# Patient Record
Sex: Male | Born: 1995 | Race: Black or African American | Hispanic: No | Marital: Single | Smoking: Current every day smoker
Health system: Southern US, Community
[De-identification: ages and names within clinical notes are randomized; demographics above are authoritative.]

## PROBLEM LIST (undated history)

## (undated) DIAGNOSIS — D571 Sickle-cell disease without crisis: Secondary | ICD-10-CM

## (undated) DIAGNOSIS — F845 Asperger's syndrome: Secondary | ICD-10-CM

## (undated) DIAGNOSIS — F319 Bipolar disorder, unspecified: Secondary | ICD-10-CM

## (undated) DIAGNOSIS — R620 Delayed milestone in childhood: Secondary | ICD-10-CM

## (undated) DIAGNOSIS — F32A Depression, unspecified: Secondary | ICD-10-CM

## (undated) DIAGNOSIS — R7303 Prediabetes: Secondary | ICD-10-CM

## (undated) DIAGNOSIS — F329 Major depressive disorder, single episode, unspecified: Secondary | ICD-10-CM

## (undated) DIAGNOSIS — F909 Attention-deficit hyperactivity disorder, unspecified type: Secondary | ICD-10-CM

## (undated) DIAGNOSIS — R569 Unspecified convulsions: Secondary | ICD-10-CM

## (undated) DIAGNOSIS — R011 Cardiac murmur, unspecified: Secondary | ICD-10-CM

## (undated) HISTORY — PX: TYMPANOSTOMY TUBE PLACEMENT: SHX32

## (undated) HISTORY — PX: INGUINAL HERNIA REPAIR: SUR1180

## (undated) HISTORY — PX: TONSILLECTOMY: SUR1361

## (undated) HISTORY — PX: EYELID LACERATION REPAIR: SHX1564

## (undated) HISTORY — PX: COSMETIC SURGERY: SHX468

---
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
## 2014-02-17 DIAGNOSIS — Z0289 Encounter for other administrative examinations: Secondary | ICD-10-CM

## 2014-08-27 ENCOUNTER — Ambulatory Visit (INDEPENDENT_AMBULATORY_CARE_PROVIDER_SITE_OTHER): Payer: Medicaid Other | Admitting: Pediatrics

## 2014-08-27 ENCOUNTER — Encounter: Payer: Self-pay | Admitting: Pediatrics

## 2014-08-27 VITALS — BP 119/59 | HR 68 | Ht 65.0 in | Wt 121.4 lb

## 2014-08-27 DIAGNOSIS — F70 Mild intellectual disabilities: Secondary | ICD-10-CM

## 2014-08-27 DIAGNOSIS — G40209 Localization-related (focal) (partial) symptomatic epilepsy and epileptic syndromes with complex partial seizures, not intractable, without status epilepticus: Secondary | ICD-10-CM

## 2014-08-27 DIAGNOSIS — G40309 Generalized idiopathic epilepsy and epileptic syndromes, not intractable, without status epilepticus: Secondary | ICD-10-CM

## 2014-08-27 MED ORDER — OXCARBAZEPINE 300 MG PO TABS: ORAL_TABLET | ORAL | Status: DC
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
## 2015-07-16 ENCOUNTER — Encounter (HOSPITAL_BASED_OUTPATIENT_CLINIC_OR_DEPARTMENT_OTHER): Payer: Self-pay

## 2015-07-16 ENCOUNTER — Emergency Department (HOSPITAL_BASED_OUTPATIENT_CLINIC_OR_DEPARTMENT_OTHER)
Admission: EM | Admit: 2015-07-16 | Discharge: 2015-07-16 | Disposition: A | Discharge status: Home / Self Care | Payer: Medicare Other | Attending: Emergency Medicine | Admitting: Emergency Medicine

## 2015-07-16 DIAGNOSIS — S3733XA Laceration of urethra, initial encounter: Secondary | ICD-10-CM

## 2015-07-16 DIAGNOSIS — Y9351 Activity, roller skating (inline) and skateboarding: Secondary | ICD-10-CM | POA: Diagnosis not present

## 2015-07-16 DIAGNOSIS — R011 Cardiac murmur, unspecified: Secondary | ICD-10-CM | POA: Insufficient documentation

## 2015-07-16 DIAGNOSIS — G40909 Epilepsy, unspecified, not intractable, without status epilepticus: Secondary | ICD-10-CM | POA: Diagnosis not present

## 2015-07-16 DIAGNOSIS — Y998 Other external cause status: Secondary | ICD-10-CM | POA: Insufficient documentation

## 2015-07-16 DIAGNOSIS — Y9389 Activity, other specified: Secondary | ICD-10-CM | POA: Diagnosis not present

## 2015-07-16 DIAGNOSIS — Z7951 Long term (current) use of inhaled steroids: Secondary | ICD-10-CM | POA: Diagnosis not present

## 2015-07-16 DIAGNOSIS — Y9289 Other specified places as the place of occurrence of the external cause: Secondary | ICD-10-CM | POA: Diagnosis not present

## 2015-07-16 DIAGNOSIS — J45909 Unspecified asthma, uncomplicated: Secondary | ICD-10-CM | POA: Diagnosis not present

## 2015-07-16 DIAGNOSIS — F319 Bipolar disorder, unspecified: Secondary | ICD-10-CM | POA: Diagnosis not present

## 2015-07-16 DIAGNOSIS — Z862 Personal history of diseases of the blood and blood-forming organs and certain disorders involving the immune mechanism: Secondary | ICD-10-CM | POA: Diagnosis not present

## 2015-07-16 DIAGNOSIS — R319 Hematuria, unspecified: Secondary | ICD-10-CM | POA: Diagnosis not present

## 2015-07-16 LAB — URINALYSIS, ROUTINE W REFLEX MICROSCOPIC
Bilirubin Urine: NEGATIVE
GLUCOSE, UA: NEGATIVE mg/dL
Ketones, ur: 15 mg/dL — AB
Nitrite: NEGATIVE
PH: 5.5 (ref 5.0–8.0)
Protein, ur: NEGATIVE mg/dL
SPECIFIC GRAVITY, URINE: 1.015 (ref 1.005–1.030)
Urobilinogen, UA: 1 mg/dL (ref 0.0–1.0)

## 2015-07-16 LAB — URINE MICROSCOPIC-ADD ON

## 2015-07-16 NOTE — Discharge Instructions (Signed)
Hematuria In your case, the blood in your urine is most likely due to a small tear in your urethra. This should heal over the next several days. If pain, swelling, fever or other signs of infection develop, return for recheck. If the blood has not resolved within the next 2-4 days, see the urologist for recheck. Hematuria is blood in your urine. It can be caused by a bladder infection, kidney infection, prostate infection, kidney stone, or cancer of your urinary tract. Infections can usually be treated with medicine, and a kidney stone usually will pass through your urine. If neither of these is the cause of your hematuria, further workup to find out the reason may be needed. It is very important that you tell your health care provider about any blood you see in your urine, even if the blood stops without treatment or happens without causing pain. Blood in your urine that happens and then stops and then happens again can be a symptom of a very serious condition. Also, pain is not a symptom in the initial stages of many urinary cancers. HOME CARE INSTRUCTIONS   Drink lots of fluid, 3-4 quarts a day. If you have been diagnosed with an infection, cranberry juice is especially recommended, in addition to large amounts of water.  Avoid caffeine, tea, and carbonated beverages because they tend to irritate the bladder.  Avoid alcohol because it may irritate the prostate.  Take all medicines as directed by your health care provider.  If you were prescribed an antibiotic medicine, finish it all even if you start to feel better.  If you have been diagnosed with a kidney stone, follow your health care provider's instructions regarding straining your urine to catch the stone.  Empty your bladder often. Avoid holding urine for long periods of time.  After a bowel movement, women should cleanse front to back. Use each tissue only once.  Empty your bladder before and after sexual intercourse if you are a  male. SEEK MEDICAL CARE IF:  You develop back pain.  You have a fever.  You have a feeling of sickness in your stomach (nausea) or vomiting.  Your symptoms are not better in 3 days. Return sooner if you are getting worse. SEEK IMMEDIATE MEDICAL CARE IF:   You develop severe vomiting and are unable to keep the medicine down.  You develop severe back or abdominal pain despite taking your medicines.  You begin passing a large amount of blood or clots in your urine.  You feel extremely weak or faint, or you pass out. MAKE SURE YOU:   Understand these instructions.  Will watch your condition.  Will get help right away if you are not doing well or get worse. Document Released: 10/18/2005 Document Revised: 03/04/2014 Document Reviewed: 06/18/2013 [facility] Patient Information 2015 [place], [place]. This information is not intended to replace advice given to you by your health care provider. Make sure you discuss any questions you have with your health care provider.
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
## 2015-09-03 ENCOUNTER — Ambulatory Visit (INDEPENDENT_AMBULATORY_CARE_PROVIDER_SITE_OTHER): Payer: Medicare Other | Admitting: Pediatrics

## 2015-09-03 ENCOUNTER — Encounter: Payer: Self-pay | Admitting: Pediatrics

## 2015-09-03 VITALS — BP 102/82 | HR 76 | Ht 65.5 in | Wt 131.4 lb

## 2015-09-03 DIAGNOSIS — F913 Oppositional defiant disorder: Secondary | ICD-10-CM

## 2015-09-03 DIAGNOSIS — G40209 Localization-related (focal) (partial) symptomatic epilepsy and epileptic syndromes with complex partial seizures, not intractable, without status epilepticus: Secondary | ICD-10-CM | POA: Diagnosis not present

## 2015-09-03 DIAGNOSIS — F70 Mild intellectual disabilities: Secondary | ICD-10-CM

## 2015-09-03 DIAGNOSIS — F902 Attention-deficit hyperactivity disorder, combined type: Secondary | ICD-10-CM | POA: Diagnosis not present

## 2015-09-03 DIAGNOSIS — G40309 Generalized idiopathic epilepsy and epileptic syndromes, not intractable, without status epilepticus: Secondary | ICD-10-CM | POA: Diagnosis not present

## 2015-09-03 MED ORDER — OXCARBAZEPINE 300 MG PO TABS: ORAL_TABLET | ORAL | Status: DC
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
## 2016-12-28 ENCOUNTER — Ambulatory Visit (INDEPENDENT_AMBULATORY_CARE_PROVIDER_SITE_OTHER): Payer: Medicare Other | Admitting: Pediatrics

## 2017-01-18 ENCOUNTER — Ambulatory Visit (INDEPENDENT_AMBULATORY_CARE_PROVIDER_SITE_OTHER): Payer: Medicare Other | Admitting: Pediatrics

## 2017-01-18 ENCOUNTER — Encounter (INDEPENDENT_AMBULATORY_CARE_PROVIDER_SITE_OTHER): Payer: Self-pay | Admitting: Pediatrics

## 2017-01-18 DIAGNOSIS — G40209 Localization-related (focal) (partial) symptomatic epilepsy and epileptic syndromes with complex partial seizures, not intractable, without status epilepticus: Secondary | ICD-10-CM | POA: Diagnosis not present

## 2017-01-18 MED ORDER — OXCARBAZEPINE 300 MG PO TABS: ORAL_TABLET | ORAL | 5 refills | Status: AC

## 2017-01-18 NOTE — Patient Instructions (Signed)
Please sign up for My Chart to facilitate communication with this office. 
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
## 2018-07-04 ENCOUNTER — Other Ambulatory Visit: Payer: Self-pay

## 2018-07-04 ENCOUNTER — Emergency Department (HOSPITAL_COMMUNITY)
Admission: EM | Admit: 2018-07-04 | Discharge: 2018-07-04 | Disposition: A | Discharge status: Home / Self Care | Payer: Medicare Other | Attending: Emergency Medicine | Admitting: Emergency Medicine

## 2018-07-04 ENCOUNTER — Emergency Department (HOSPITAL_COMMUNITY): Payer: Medicare Other

## 2018-07-04 ENCOUNTER — Encounter (HOSPITAL_COMMUNITY): Payer: Self-pay | Admitting: Emergency Medicine

## 2018-07-04 DIAGNOSIS — J45909 Unspecified asthma, uncomplicated: Secondary | ICD-10-CM | POA: Insufficient documentation

## 2018-07-04 DIAGNOSIS — Y999 Unspecified external cause status: Secondary | ICD-10-CM | POA: Diagnosis not present

## 2018-07-04 DIAGNOSIS — Y9351 Activity, roller skating (inline) and skateboarding: Secondary | ICD-10-CM | POA: Diagnosis not present

## 2018-07-04 DIAGNOSIS — S82891A Other fracture of right lower leg, initial encounter for closed fracture: Secondary | ICD-10-CM | POA: Diagnosis not present

## 2018-07-04 DIAGNOSIS — F17290 Nicotine dependence, other tobacco product, uncomplicated: Secondary | ICD-10-CM | POA: Insufficient documentation

## 2018-07-04 DIAGNOSIS — Z79899 Other long term (current) drug therapy: Secondary | ICD-10-CM | POA: Insufficient documentation

## 2018-07-04 DIAGNOSIS — S99911A Unspecified injury of right ankle, initial encounter: Secondary | ICD-10-CM | POA: Diagnosis present

## 2018-07-04 DIAGNOSIS — Y929 Unspecified place or not applicable: Secondary | ICD-10-CM | POA: Insufficient documentation

## 2018-07-04 DIAGNOSIS — W1789XA Other fall from one level to another, initial encounter: Secondary | ICD-10-CM | POA: Diagnosis not present
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
## 2018-07-04 NOTE — ED Notes (Signed)
ED Provider at bedside. 

## 2018-07-04 NOTE — ED Notes (Signed)
Pt has returned from CT. Ortho tech paged 3 x.

## 2018-07-04 NOTE — Progress Notes (Signed)
Orthopedic Tech Progress Note Patient Details:  Shawn Berg 02/11/1996 [id number]  Ortho Devices Type of Ortho Device: Ace wrap, Crutches, Short leg splint Ortho Device/Splint Interventions: Application   Post Interventions Patient Tolerated: Well Instructions Provided: Care of device   Saul Fordyce 07/04/2018, 3:21 PM
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
## 2018-07-04 NOTE — ED Notes (Signed)
Patient transported to X-ray 

## 2018-07-04 NOTE — ED Triage Notes (Signed)
Pt here with confirmed right ankle fracture on Saturday at [facility] clinic. The neccessary equipment was not available. Ankle is wrapped in ace wrap.

## 2018-07-04 NOTE — Discharge Instructions (Addendum)
You may alternate the ibuprofen or tylenol for your pain.Please keep ankle elevated and splint clean and dry. I have provided the number to Dr. Carola Frost please call tomorrow in order to make an appointment.If you experience any numbness in you foot, tingling or pain out of proportion please return to the ED for reevaluation.

## 2018-07-04 NOTE — ED Notes (Signed)
Ortho Tech now at bedside.

## 2018-07-04 NOTE — ED Notes (Signed)
Casimiro Needle PA, at bedside.

## 2018-07-17 ENCOUNTER — Encounter (HOSPITAL_COMMUNITY): Payer: Self-pay

## 2018-07-17 NOTE — Progress Notes (Signed)
Spoke to patients father Casimiro NeedleMichael (pt. Guardian) and gave pre-op instructions. Informed patients father to be at short stay at 0800. Patients father verbalized understanding

## 2018-07-18 ENCOUNTER — Ambulatory Visit (HOSPITAL_COMMUNITY): Payer: Medicare Other | Admitting: Anesthesiology

## 2018-07-18 ENCOUNTER — Ambulatory Visit (HOSPITAL_COMMUNITY): Payer: Medicare Other

## 2018-07-18 ENCOUNTER — Ambulatory Visit (HOSPITAL_COMMUNITY)
Admission: RE | Admit: 2018-07-18 | Discharge: 2018-07-18 | Disposition: A | Discharge status: Home / Self Care | Payer: Medicare Other | Source: Ambulatory Visit | Attending: Orthopedic Surgery | Admitting: Orthopedic Surgery

## 2018-07-18 ENCOUNTER — Encounter (HOSPITAL_COMMUNITY)
Admission: RE | Disposition: A | Discharge status: Home / Self Care | Payer: Self-pay | Source: Ambulatory Visit | Attending: Orthopedic Surgery

## 2018-07-18 ENCOUNTER — Encounter (HOSPITAL_COMMUNITY): Payer: Self-pay | Admitting: General Practice

## 2018-07-18 ENCOUNTER — Other Ambulatory Visit: Payer: Self-pay

## 2018-07-18 DIAGNOSIS — F845 Asperger's syndrome: Secondary | ICD-10-CM | POA: Diagnosis not present

## 2018-07-18 DIAGNOSIS — S82841A Displaced bimalleolar fracture of right lower leg, initial encounter for closed fracture: Secondary | ICD-10-CM | POA: Insufficient documentation

## 2018-07-18 DIAGNOSIS — S82891A Other fracture of right lower leg, initial encounter for closed fracture: Secondary | ICD-10-CM

## 2018-07-18 DIAGNOSIS — T148XXA Other injury of unspecified body region, initial encounter: Secondary | ICD-10-CM

## 2018-07-18 DIAGNOSIS — F319 Bipolar disorder, unspecified: Secondary | ICD-10-CM | POA: Insufficient documentation

## 2018-07-18 DIAGNOSIS — Z419 Encounter for procedure for purposes other than remedying health state, unspecified: Secondary | ICD-10-CM

## 2018-07-18 DIAGNOSIS — S93439A Sprain of tibiofibular ligament of unspecified ankle, initial encounter: Secondary | ICD-10-CM | POA: Diagnosis not present

## 2018-07-18 DIAGNOSIS — Z8501 Personal history of malignant neoplasm of esophagus: Secondary | ICD-10-CM | POA: Diagnosis not present

## 2018-07-18 DIAGNOSIS — Z79899 Other long term (current) drug therapy: Secondary | ICD-10-CM | POA: Diagnosis not present

## 2018-07-18 DIAGNOSIS — F17290 Nicotine dependence, other tobacco product, uncomplicated: Secondary | ICD-10-CM | POA: Insufficient documentation

## 2018-07-18 DIAGNOSIS — D571 Sickle-cell disease without crisis: Secondary | ICD-10-CM | POA: Insufficient documentation

## 2018-07-18 DIAGNOSIS — K219 Gastro-esophageal reflux disease without esophagitis: Secondary | ICD-10-CM | POA: Diagnosis not present

## 2018-07-18 DIAGNOSIS — Y9351 Activity, roller skating (inline) and skateboarding: Secondary | ICD-10-CM | POA: Diagnosis not present

## 2018-07-18 DIAGNOSIS — R569 Unspecified convulsions: Secondary | ICD-10-CM | POA: Diagnosis not present

## 2018-07-18 DIAGNOSIS — W19XXXA Unspecified fall, initial encounter: Secondary | ICD-10-CM | POA: Insufficient documentation

## 2018-07-18 HISTORY — PX: ORIF ANKLE FRACTURE: SHX5408

## 2018-07-18 HISTORY — DX: Prediabetes: R73.03

## 2018-07-18 LAB — CBC
HEMATOCRIT: 42.4 % (ref 39.0–52.0)
Hemoglobin: 14.2 g/dL (ref 13.0–17.0)
MCH: 30.3 pg (ref 26.0–34.0)
MCHC: 33.5 g/dL (ref 30.0–36.0)
MCV: 90.4 fL (ref 78.0–100.0)
Platelets: 294 10*3/uL (ref 150–400)
RBC: 4.69 MIL/uL (ref 4.22–5.81)
RDW: 13.2 % (ref 11.5–15.5)
WBC: 6 10*3/uL (ref 4.0–10.5)

## 2018-07-18 SURGERY — OPEN REDUCTION INTERNAL FIXATION (ORIF) ANKLE FRACTURE
Anesthesia: General | Laterality: Right

## 2018-07-18 MED ORDER — DEXAMETHASONE SODIUM PHOSPHATE 10 MG/ML IJ SOLN
INTRAMUSCULAR | Status: DC | PRN
  Administered 2018-07-18: 10 mg via INTRAVENOUS

## 2018-07-18 MED ORDER — FENTANYL CITRATE (PF) 250 MCG/5ML IJ SOLN
INTRAMUSCULAR | Status: AC
  Filled 2018-07-18: qty 5

## 2018-07-18 MED ORDER — KETAMINE HCL 50 MG/5ML IJ SOSY
PREFILLED_SYRINGE | INTRAMUSCULAR | Status: AC
  Filled 2018-07-18: qty 5

## 2018-07-18 MED ORDER — ASPIRIN EC 325 MG PO TBEC: 325.0000 mg | DELAYED_RELEASE_TABLET | Freq: Every day | ORAL | 0 refills | Status: AC

## 2018-07-18 MED ORDER — SUGAMMADEX SODIUM 200 MG/2ML IV SOLN
INTRAVENOUS | Status: DC | PRN
  Administered 2018-07-18: 136 mg via INTRAVENOUS

## 2018-07-18 MED ORDER — DEXAMETHASONE SODIUM PHOSPHATE 10 MG/ML IJ SOLN
INTRAMUSCULAR | Status: AC
  Filled 2018-07-18: qty 1

## 2018-07-18 MED ORDER — KETAMINE HCL 10 MG/ML IJ SOLN
INTRAMUSCULAR | Status: DC | PRN
  Administered 2018-07-18: 10 mg via INTRAVENOUS
  Administered 2018-07-18: 15 mg via INTRAVENOUS

## 2018-07-18 MED ORDER — ROCURONIUM BROMIDE 50 MG/5ML IV SOSY
PREFILLED_SYRINGE | INTRAVENOUS | Status: AC
  Filled 2018-07-18: qty 5

## 2018-07-18 MED ORDER — MIDAZOLAM HCL 2 MG/2ML IJ SOLN
INTRAMUSCULAR | Status: AC
  Filled 2018-07-18: qty 2

## 2018-07-18 MED ORDER — METHOCARBAMOL 500 MG PO TABS: 500.0000 mg | ORAL_TABLET | Freq: Three times a day (TID) | ORAL | 0 refills | Status: AC | PRN

## 2018-07-18 MED ORDER — ONDANSETRON 4 MG PO TBDP: 4.0000 mg | ORAL_TABLET | Freq: Three times a day (TID) | ORAL | 0 refills | Status: AC | PRN

## 2018-07-18 MED ORDER — ONDANSETRON HCL 4 MG/2ML IJ SOLN
INTRAMUSCULAR | Status: DC | PRN
  Administered 2018-07-18: 4 mg via INTRAVENOUS

## 2018-07-18 MED ORDER — MIDAZOLAM HCL 2 MG/2ML IJ SOLN
2.0000 mg | Freq: Once | INTRAMUSCULAR | Status: AC
  Administered 2018-07-18: 2 mg via INTRAVENOUS

## 2018-07-18 MED ORDER — CEFAZOLIN SODIUM-DEXTROSE 2-4 GM/100ML-% IV SOLN
2.0000 g | INTRAVENOUS | Status: AC
  Administered 2018-07-18: 2 g via INTRAVENOUS
  Filled 2018-07-18: qty 100

## 2018-07-18 MED ORDER — MEPERIDINE HCL 50 MG/ML IJ SOLN: 6.2500 mg | INTRAMUSCULAR | Status: DC | PRN

## 2018-07-18 MED ORDER — MIDAZOLAM HCL 2 MG/2ML IJ SOLN
INTRAMUSCULAR | Status: AC
  Administered 2018-07-18: 2 mg via INTRAVENOUS
  Filled 2018-07-18: qty 2

## 2018-07-18 MED ORDER — FENTANYL CITRATE (PF) 100 MCG/2ML IJ SOLN
INTRAMUSCULAR | Status: AC
  Administered 2018-07-18: 100 ug via INTRAVENOUS
  Filled 2018-07-18: qty 2

## 2018-07-18 MED ORDER — CHLORHEXIDINE GLUCONATE 4 % EX LIQD: 60.0000 mL | Freq: Once | CUTANEOUS | Status: DC

## 2018-07-18 MED ORDER — HYDROMORPHONE HCL 1 MG/ML IJ SOLN: 0.2500 mg | INTRAMUSCULAR | Status: DC | PRN

## 2018-07-18 MED ORDER — HYDROCODONE-ACETAMINOPHEN 7.5-325 MG PO TABS: 1.0000 | ORAL_TABLET | Freq: Four times a day (QID) | ORAL | 0 refills | Status: AC | PRN

## 2018-07-18 MED ORDER — MIDAZOLAM HCL 2 MG/2ML IJ SOLN: 0.5000 mg | Freq: Once | INTRAMUSCULAR | Status: DC | PRN

## 2018-07-18 MED ORDER — GABAPENTIN 300 MG PO CAPS
300.0000 mg | ORAL_CAPSULE | Freq: Once | ORAL | Status: AC
  Administered 2018-07-18: 300 mg via ORAL
  Filled 2018-07-18: qty 1

## 2018-07-18 MED ORDER — 0.9 % SODIUM CHLORIDE (POUR BTL) OPTIME
TOPICAL | Status: DC | PRN
  Administered 2018-07-18: 1000 mL

## 2018-07-18 MED ORDER — PROPOFOL 10 MG/ML IV BOLUS
INTRAVENOUS | Status: DC | PRN
  Administered 2018-07-18: 200 mg via INTRAVENOUS

## 2018-07-18 MED ORDER — LACTATED RINGERS IV SOLN
INTRAVENOUS | Status: DC
  Administered 2018-07-18: 09:00:00 via INTRAVENOUS

## 2018-07-18 MED ORDER — ROPIVACAINE HCL 5 MG/ML IJ SOLN
INTRAMUSCULAR | Status: DC | PRN
  Administered 2018-07-18: 30 mL via PERINEURAL
  Administered 2018-07-18: 20 mL via PERINEURAL

## 2018-07-18 MED ORDER — ONDANSETRON HCL 4 MG/2ML IJ SOLN
INTRAMUSCULAR | Status: AC
  Filled 2018-07-18: qty 2

## 2018-07-18 MED ORDER — PROPOFOL 10 MG/ML IV BOLUS
INTRAVENOUS | Status: AC
  Filled 2018-07-18: qty 40

## 2018-07-18 MED ORDER — PROMETHAZINE HCL 25 MG/ML IJ SOLN: 6.2500 mg | INTRAMUSCULAR | Status: DC | PRN

## 2018-07-18 MED ORDER — ROCURONIUM BROMIDE 50 MG/5ML IV SOSY
PREFILLED_SYRINGE | INTRAVENOUS | Status: DC | PRN
  Administered 2018-07-18: 50 mg via INTRAVENOUS

## 2018-07-18 MED ORDER — ACETAMINOPHEN 500 MG PO TABS
1000.0000 mg | ORAL_TABLET | Freq: Once | ORAL | Status: AC
  Administered 2018-07-18: 1000 mg via ORAL
  Filled 2018-07-18: qty 2

## 2018-07-18 MED ORDER — FENTANYL CITRATE (PF) 100 MCG/2ML IJ SOLN
100.0000 ug | Freq: Once | INTRAMUSCULAR | Status: AC
  Administered 2018-07-18: 100 ug via INTRAVENOUS

## 2018-07-18 SURGICAL SUPPLY — 63 items
BANDAGE ACE 4X5 VEL STRL LF (GAUZE/BANDAGES/DRESSINGS) ×3 IMPLANT
BANDAGE ACE 6X5 VEL STRL LF (GAUZE/BANDAGES/DRESSINGS) ×3 IMPLANT
BANDAGE ESMARK 6X9 LF (GAUZE/BANDAGES/DRESSINGS) ×1 IMPLANT
BIT DRILL 2.5X2.75 QC CALB (BIT) ×3 IMPLANT
BNDG ESMARK 6X9 LF (GAUZE/BANDAGES/DRESSINGS) ×3
BNDG GAUZE ELAST 4 BULKY (GAUZE/BANDAGES/DRESSINGS) ×6 IMPLANT
BRUSH SCRUB SURG 4.25 DISP (MISCELLANEOUS) ×6 IMPLANT
COVER MAYO STAND STRL (DRAPES) ×3 IMPLANT
COVER SURGICAL LIGHT HANDLE (MISCELLANEOUS) ×3 IMPLANT
DEVICE FIXATION SYNDESMOSIS (Bone Implant) ×3 IMPLANT
DRAPE C-ARM 42X72 X-RAY (DRAPES) IMPLANT
DRAPE C-ARMOR (DRAPES) ×6 IMPLANT
DRAPE HALF SHEET 40X57 (DRAPES) ×3 IMPLANT
DRAPE U-SHAPE 47X51 STRL (DRAPES) ×3 IMPLANT
DRSG EMULSION OIL 3X3 NADH (GAUZE/BANDAGES/DRESSINGS) IMPLANT
ELECT REM PT RETURN 9FT ADLT (ELECTROSURGICAL) ×3
ELECTRODE REM PT RTRN 9FT ADLT (ELECTROSURGICAL) ×1 IMPLANT
FIXATION ZIPTIGHT ANKLE SNDSMS (Ankle) ×1 IMPLANT
GAUZE SPONGE 4X4 12PLY STRL (GAUZE/BANDAGES/DRESSINGS) IMPLANT
GLOVE BIO SURGEON STRL SZ7.5 (GLOVE) ×3 IMPLANT
GLOVE BIO SURGEON STRL SZ8 (GLOVE) ×3 IMPLANT
GLOVE BIOGEL PI IND STRL 7.5 (GLOVE) ×1 IMPLANT
GLOVE BIOGEL PI IND STRL 8 (GLOVE) ×1 IMPLANT
GLOVE BIOGEL PI INDICATOR 7.5 (GLOVE) ×2
GLOVE BIOGEL PI INDICATOR 8 (GLOVE) ×2
GOWN STRL REUS W/ TWL LRG LVL3 (GOWN DISPOSABLE) ×2 IMPLANT
GOWN STRL REUS W/ TWL XL LVL3 (GOWN DISPOSABLE) ×1 IMPLANT
GOWN STRL REUS W/TWL LRG LVL3 (GOWN DISPOSABLE) ×4
GOWN STRL REUS W/TWL XL LVL3 (GOWN DISPOSABLE) ×2
KIT BASIN OR (CUSTOM PROCEDURE TRAY) ×3 IMPLANT
KIT TURNOVER KIT B (KITS) ×3 IMPLANT
MANIFOLD NEPTUNE II (INSTRUMENTS) ×3 IMPLANT
NEEDLE HYPO 21X1.5 SAFETY (NEEDLE) IMPLANT
NS IRRIG 1000ML POUR BTL (IV SOLUTION) ×3 IMPLANT
PACK GENERAL/GYN (CUSTOM PROCEDURE TRAY) ×3 IMPLANT
PACK ORTHO EXTREMITY (CUSTOM PROCEDURE TRAY) ×3 IMPLANT
PAD ARMBOARD 7.5X6 YLW CONV (MISCELLANEOUS) ×6 IMPLANT
PAD CAST 4YDX4 CTTN HI CHSV (CAST SUPPLIES) ×1 IMPLANT
PADDING CAST COTTON 4X4 STRL (CAST SUPPLIES) ×2
PADDING CAST COTTON 6X4 STRL (CAST SUPPLIES) ×3 IMPLANT
PLATE ACE 3.5MM 12HOLE (Plate) ×3 IMPLANT
SCREW CORTICAL 3.5MM  16MM (Screw) ×4 IMPLANT
SCREW CORTICAL 3.5MM 14MM (Screw) ×6 IMPLANT
SCREW CORTICAL 3.5MM 16MM (Screw) ×2 IMPLANT
SCREW NLOCK CANC HEX 4X18 (Screw) ×4 IMPLANT
SCREW NLOCK CANC HEX 4X18 FIB (Screw) ×2 IMPLANT
SPLINT PLASTER CAST XFAST 5X30 (CAST SUPPLIES) ×1 IMPLANT
SPLINT PLASTER XFAST SET 5X30 (CAST SUPPLIES) ×2
SPONGE LAP 18X18 X RAY DECT (DISPOSABLE) ×3 IMPLANT
STAPLER VISISTAT 35W (STAPLE) IMPLANT
SUCTION FRAZIER HANDLE 10FR (MISCELLANEOUS) ×2
SUCTION TUBE FRAZIER 10FR DISP (MISCELLANEOUS) ×1 IMPLANT
SUT ETHILON 3 0 PS 1 (SUTURE) ×6 IMPLANT
SUT PDS AB 2-0 CT1 27 (SUTURE) IMPLANT
SUT VIC AB 2-0 CT1 27 (SUTURE) ×2
SUT VIC AB 2-0 CT1 TAPERPNT 27 (SUTURE) ×1 IMPLANT
TOWEL OR 17X24 6PK STRL BLUE (TOWEL DISPOSABLE) ×3 IMPLANT
TOWEL OR 17X26 10 PK STRL BLUE (TOWEL DISPOSABLE) ×6 IMPLANT
TUBE CONNECTING 12'X1/4 (SUCTIONS) ×1
TUBE CONNECTING 12X1/4 (SUCTIONS) ×2 IMPLANT
UNDERPAD 30X30 (UNDERPADS AND DIAPERS) ×3 IMPLANT
WATER STERILE IRR 1000ML POUR (IV SOLUTION) ×3 IMPLANT
ZIPTIGHT ANKLE SYNODESMOSS FIX (Ankle) ×3 IMPLANT

## 2018-07-18 NOTE — Transfer of Care (Signed)
Immediate Anesthesia Transfer of Care Note  Patient: Shawn Berg  Procedure(s) Performed: OPEN REDUCTION INTERNAL FIXATION (ORIF) ANKLE FRACTURE  AND SYNDOMOSIS REPAIR (Right )  Patient Location: PACU  Anesthesia Type:General and Regional  Level of Consciousness: awake, alert , oriented and sedated  Airway & Oxygen Therapy: Patient Spontanous Breathing and Patient connected to nasal cannula oxygen  Post-op Assessment: Report given to RN, Post -op Vital signs reviewed and stable and Patient moving all extremities  Post vital signs: Reviewed and stable  Last Vitals:  Vitals Value Taken Time  BP 147/99 07/18/2018  2:50 PM  Temp    Pulse 71 07/18/2018  2:54 PM  Resp 15 07/18/2018  2:54 PM  SpO2 100 % 07/18/2018  2:54 PM  Vitals shown include unvalidated device data.  Last Pain:  Vitals:   07/18/18 0848  TempSrc:   PainSc: 0-No pain      Patients Stated Pain Goal: 3 (07/18/18 0848)  Complications: No apparent anesthesia complications

## 2018-07-18 NOTE — Anesthesia Postprocedure Evaluation (Signed)
Anesthesia Post Note  Patient: Shawn Berg  Procedure(s) Performed: OPEN REDUCTION INTERNAL FIXATION (ORIF) ANKLE FRACTURE  AND SYNDOMOSIS REPAIR (Right )     Patient location during evaluation: PACU Anesthesia Type: General and Regional Level of consciousness: awake and alert, oriented and patient cooperative Pain management: pain level controlled Vital Signs Assessment: post-procedure vital signs reviewed and stable Respiratory status: spontaneous breathing, nonlabored ventilation and respiratory function stable Cardiovascular status: blood pressure returned to baseline and stable Postop Assessment: no apparent nausea or vomiting Anesthetic complications: no    Last Vitals:  Vitals:   07/18/18 1109 07/18/18 1450  BP: 120/69 (!) 147/99  Pulse: (!) 47 81  Resp: 12 14  Temp:  36.6 C  SpO2: 100% 100%    Last Pain:  Vitals:   07/18/18 1450  TempSrc:   PainSc: 0-No pain                 Melana Hingle,E. Shellee Streng

## 2018-07-18 NOTE — Anesthesia Preprocedure Evaluation (Addendum)
Anesthesia Evaluation  Patient identified by MRN, date of birth, ID band Patient awake    Reviewed: Allergy & Precautions, NPO status , Patient's Chart, lab work & pertinent test results  History of Anesthesia Complications Negative for: history of anesthetic complications  Airway Mallampati: II  TM Distance: >3 FB Neck ROM: Full    Dental  (+) Dental Advisory Given, Chipped   Pulmonary Current Smoker,    breath sounds clear to auscultation       Cardiovascular negative cardio ROS   Rhythm:Regular Rate:Normal     Neuro/Psych negative neurological ROS     GI/Hepatic Neg liver ROS, GERD  Medicated and Controlled,  Endo/Other  negative endocrine ROS  Renal/GU negative Renal ROS     Musculoskeletal   Abdominal   Peds  Hematology negative hematology ROS (+)   Anesthesia Other Findings   Reproductive/Obstetrics                            Anesthesia Physical Anesthesia Plan  ASA: II  Anesthesia Plan: General   Post-op Pain Management: GA combined w/ Regional for post-op pain   Induction: Intravenous  PONV Risk Score and Plan: 1 and Ondansetron and Dexamethasone  Airway Management Planned: Oral ETT  Additional Equipment:   Intra-op Plan:   Post-operative Plan: Extubation in OR  Informed Consent: I have reviewed the patients History and Physical, chart, labs and discussed the procedure including the risks, benefits and alternatives for the proposed anesthesia with the patient or authorized representative who has indicated his/her understanding and acceptance.   Dental advisory given  Plan Discussed with: CRNA and Surgeon  Anesthesia Plan Comments: (Plan routine monitors, GETA)        Anesthesia Quick Evaluation

## 2018-07-18 NOTE — Discharge Instructions (Addendum)
[facility]edic [facility]uma Service Discharge Instructions   General Discharge Instructions  WEIGHT BEARING STATUS: Nonweightbearing right leg  RANGE OF MOTION/ACTIVITY: Okay to move right toes and right knee.  Do not remove splint  Wound Care: Do not remove splint.  Keep splint dry.  We will remove splint at her first postoperative visit  DVT/PE prophylaxis: aspirin 325 mg daily x 4 weeks   Diet: as you were eating previously.  Can use over the counter stool softeners and bowel preparations, such as Miralax, to help with bowel movements.  Narcotics can be constipating.  Be sure to drink plenty of fluids  PAIN MEDICATION USE AND EXPECTATIONS  You have likely been given narcotic medications to help control your pain.  After a traumatic event that results in an fracture (broken bone) with or without surgery, it is ok to use narcotic pain medications to help control one's pain.  We understand that everyone responds to pain differently and each individual patient will be evaluated on a regular basis for the continued need for narcotic medications. Ideally, narcotic medication use should last no more than 6-8 weeks (coinciding with fracture healing).   As a patient it is your responsibility as well to monitor narcotic medication use and report the amount and frequency you use these medications when you come to your office visit.   We would also advise that if you are using narcotic medications, you should take a dose prior to therapy to maximize you participation.  IF YOU ARE ON NARCOTIC MEDICATIONS IT IS NOT PERMISSIBLE TO OPERATE A MOTOR VEHICLE (MOTORCYCLE/CAR/TRUCK/MOPED) OR HEAVY MACHINERY DO NOT MIX NARCOTICS WITH OTHER CNS (CENTRAL NERVOUS SYSTEM) DEPRESSANTS SUCH AS ALCOHOL   STOP SMOKING OR USING NICOTINE PRODUCTS!!!!  As discussed nicotine severely impairs your body's ability to heal surgical and traumatic wounds but also impairs bone healing.  Wounds and bone heal by forming microscopic blood  vessels (angiogenesis) and nicotine is a vasoconstrictor (essentially, shrinks blood vessels).  Therefore, if vasoconstriction occurs to these microscopic blood vessels they essentially disappear and are unable to deliver necessary nutrients to the healing tissue.  This is one modifiable factor that you can do to dramatically increase your chances of healing your injury.    (This means no smoking, no nicotine gum, patches, etc)  DO NOT USE NONSTEROIDAL ANTI-INFLAMMATORY DRUGS (NSAID'S)  Using products such as Advil (ibuprofen), Aleve (naproxen), Motrin (ibuprofen) for additional pain control during fracture healing can delay and/or prevent the healing response.  If you would like to take over the counter (OTC) medication, Tylenol (acetaminophen) is ok.  However, some narcotic medications that are given for pain control contain acetaminophen as well. Therefore, you should not exceed more than 4000 mg of tylenol in a day if you do not have liver disease.  Also note that there are may OTC medicines, such as cold medicines and allergy medicines that my contain tylenol as well.  If you have any questions about medications and/or interactions please ask your doctor/PA or your pharmacist.      ICE AND ELEVATE INJURED/OPERATIVE EXTREMITY  Using ice and elevating the injured extremity above your heart can help with swelling and pain control.  Icing in a pulsatile fashion, such as 20 minutes on and 20 minutes off, can be followed.    Do not place ice directly on skin. Make sure there is a barrier between to skin and the ice pack.    Using frozen items such as frozen peas works well as the conform nicely  to the are that needs to be iced.  USE AN ACE WRAP OR TED HOSE FOR SWELLING CONTROL  In addition to icing and elevation, Ace wraps or TED hose are used to help limit and resolve swelling.  It is recommended to use Ace wraps or TED hose until you are informed to stop.    When using Ace Wraps start the wrapping  distally (farthest away from the body) and wrap proximally (closer to the body)   Example: If you had surgery on your leg or thing and you do not have a splint on, start the ace wrap at the toes and work your way up to the thigh        If you had surgery on your upper extremity and do not have a splint on, start the ace wrap at your fingers and work your way up to the upper arm  IF YOU ARE IN A SPLINT OR CAST DO NOT REMOVE IT FOR ANY REASON   If your splint gets wet for any reason please contact the office immediately. You may shower in your splint or cast as long as you keep it dry.  This can be done by wrapping in a cast cover or garbage back (or similar)  Do Not stick any thing down your splint or cast such as pencils, money, or hangers to try and scratch yourself with.  If you feel itchy take benadryl as prescribed on the bottle for itching  IF YOU ARE IN A CAM BOOT (BLACK BOOT)  You may remove boot periodically. Perform daily dressing changes as noted below.  Wash the liner of the boot regularly and wear a sock when wearing the boot. It is recommended that you sleep in the boot until told otherwise  CALL THE OFFICE WITH ANY QUESTIONS OR CONCERNS: [phone]

## 2018-07-18 NOTE — Anesthesia Procedure Notes (Signed)
Procedure Name: Intubation Date/Time: 07/18/2018 12:36 PM Performed by: Scheryl Darter, CRNA Pre-anesthesia Checklist: Patient identified, Emergency Drugs available, Suction available and Patient being monitored Patient Re-evaluated:Patient Re-evaluated prior to induction Oxygen Delivery Method: Circle System Utilized Preoxygenation: Pre-oxygenation with 100% oxygen Induction Type: IV induction Ventilation: Mask ventilation without difficulty Laryngoscope Size: Mac and 4 Grade View: Grade I Tube type: Oral Tube size: 7.5 mm Number of attempts: 1 Airway Equipment and Method: Stylet and Oral airway Placement Confirmation: ETT inserted through vocal cords under direct vision,  positive ETCO2 and breath sounds checked- equal and bilateral Secured at: 24 cm Tube secured with: Tape Dental Injury: Teeth and Oropharynx as per pre-operative assessment  Comments: Intubated by [facility]

## 2018-07-18 NOTE — Anesthesia Procedure Notes (Signed)
Anesthesia Regional Block: Popliteal block   Pre-Anesthetic Checklist: ,, timeout performed, Correct Patient, Correct Site, Correct Laterality, Correct Procedure, Correct Position, site marked, Risks and benefits discussed,  Surgical consent,  Pre-op evaluation,  At surgeon's request and post-op pain management  Laterality: Right and Lower  Prep: chloraprep       Needles:  Injection technique: Single-shot  Needle Type: Echogenic Stimulator Needle     Needle Length: 9cm  Needle Gauge: 21     Additional Needles:   Procedures:, nerve stimulator,,, ultrasound used (permanent image in chart),,,,   Nerve Stimulator or Paresthesia:  Response: toe twitch, 0.4 mA, 0.1 ms,   Additional Responses:   Narrative:  Start time: 07/18/2018 10:53 AM End time: 07/18/2018 11:07 AM Injection made incrementally with aspirations every 5 mL.  Performed by: Personally  Anesthesiologist: Jairo BenJackson, Kandis Henry, MD  Additional Notes: Pt identified in Holding room.  Monitors applied. Working IV access confirmed. Sterile prep R lateral knee.  #21ga ECHOgenic PNS to toe twitch at 0.7440mA threshold with US guidance.  30cc 0.5% Ropivacaine injected incrementally after negative test dose.  Patient asymptomatic, VSS, no heme aspirated, tolerated well.  Sandford Craze Cora Brierley, MD
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
## 2018-07-18 NOTE — Anesthesia Procedure Notes (Signed)
Anesthesia Regional Block: Adductor canal block   Pre-Anesthetic Checklist: ,, timeout performed, Correct Patient, Correct Site, Correct Laterality, Correct Procedure, Correct Position, site marked, Risks and benefits discussed,  Surgical consent,  Pre-op evaluation,  At surgeon's request and post-op pain management  Laterality: Right and Lower  Prep: chloraprep       Needles:  Injection technique: Single-shot  Needle Type: Echogenic Needle     Needle Length: 9cm  Needle Gauge: 21     Additional Needles:   Procedures:,,,, ultrasound used (permanent image in chart),,,,  Narrative:  Start time: 07/18/2018 10:41 AM End time: 07/18/2018 10:49 AM Injection made incrementally with aspirations every 5 mL.  Performed by: Personally  Anesthesiologist: Jairo BenJackson, Emersen Carroll, MD  Additional Notes: Pt identified in Holding room.  Monitors applied. Working IV access confirmed. Sterile prep, drape R thigh.  #21ga ECHOgenic needle into adductor canal with US guidance.  20cc 0.5% Ropivacaine injected incrementally after negative test dose.  Patient asymptomatic, VSS, no heme aspirated, tolerated well.  Sandford Craze Timothey Dahlstrom, MD

## 2018-07-26 ENCOUNTER — Encounter (HOSPITAL_COMMUNITY): Payer: Self-pay | Admitting: Orthopedic Surgery

## 2018-07-31 NOTE — Op Note (Signed)
NAME: Shawn Berg, Shawn Berg MEDICAL RECORD ZO:[id number] ACCOUNT [id number] DATE OF BIRTH:[birth year] FACILITY: [facility] LOCATION: [facility] PHYSICIAN:Hebert Dooling H. Cing , MD  OPERATIVE REPORT  DATE OF PROCEDURE:  07/18/2018  PREOPERATIVE DIAGNOSES:   1.  Right bimalleolar Weber C ankle fracture. 2.  Right syndesmosis disruption.  POSTOPERATIVE DIAGNOSES:   1.  Right bimalleolar Weber C ankle fracture. 2.  Right syndesmosis disruption.  PROCEDURES: 1.  Open reduction internal fixation of right bimalleolar ankle fracture, Weber C fibula. 2.  Open reduction internal fixation of right ankle syndesmosis. 3.  Stress fluoroscopy of the right ankle syndesmosis.  SURGEON:  Myrene Galas, MD  ASSISTANT:  None.  ANESTHESIA:  General, supplemented with regional.  COMPLICATIONS:  None.  TOURNIQUET:  None.  DISPOSITION:  To PACU.  CONDITION:  Stable.    BRIEF SUMMARY OF INDICATIONS FOR PROCEDURE:  The patient is very active 22 year old with multiple issues related to his conditions around his birth and a fetal alcohol syndrome.  The patient sustained a skateboard injury resulting in a comminuted Weber C  fracture as well as an avulsion of the medial malleolus with high suspicion for syndesmotic disruption, although there was no clear radiographic ____ from the initial.  I discussed with him and his father the risks and benefits of surgical repair,  including potential for nerve injury, vessel injury breakage of the screw fixation if used in syndesmosis or rupture of the other fixation.  He strongly wished to proceed.  SUMMARY OF PROCEDURE:  The patient was taken to the operating room where general anesthesia was induced.  The right lower extremity was prepped and draped in the usual sterile fashion.  No tourniquet was used during the procedure.  We began with lateral  incision because of the extensively comminuted fracture.  This required careful retraction of the superficial peroneal nerve  anteriorly.  We then were able to identify the fracture site and pull the fibula back out to length.  There was already  considerable periosteal bridging and callus.  Once this fracture interdigitated and out to length, we were able to manipulate with regard to the medial malleolus as a result and this fragment appeared to be in good position with no further fixation  required on the medial side, as this fragment again was small enough that fixation could not be readily achieved.  I then brought in C-arm and confirmed with an external rotation stress view that there was indeed syndesmotic disruption, as the  syndesmosis was clearly opened and talus translated.  I then used 2 TightRope like devices, drilling had an anterior trajectory through the fibula into the tibia and across the medial side, bringing them out under direct visualization so as to avoid  possible entrapment of the saphenous nerve or vessel.  Once these were deployed on the far side, the sutures were sequentially tensioned, tightened and cut.  C-arm was then brought back in and fluoroscopy stress evaluation repeated and this showed them  to be quite stable.  All wounds were irrigated thoroughly and then a standard layered closure performed.  There were no complications.  The patient was placed into gently compressive dressing, well-padded posterior and stirrup splint, taken to PACU in  stable condition.  PROGNOSIS:  The patient will need to be compliant.  I have discussed this in detail with both him and his parents, as well as his girlfriend and all parties agreed to assist as much as possible.  We will plan to see him back for removal of sutures in 2  weeks and conversion into a Cam boot or cast with maintenance of nonweightbearing for 8 weeks.  JN/NUANCE  D:07/30/2018 T:07/31/2018 JOB:[id number]

## 2020-02-23 IMAGING — CT CT ANKLE*R* W/O CM
3 of 4 series · 14 of 33 positions shown, 17 images · non-contrast
Comparison: None.

CLINICAL DATA: Skateboarding accident on [REDACTED]. Pain and
swelling of the right ankle.

EXAM:
CT OF THE RIGHT ANKLE WITHOUT CONTRAST
TECHNIQUE: Multidetector CT imaging of the right ankle was performed according
to the standard protocol. Multiplanar CT image reconstructions were
also generated.

[Series 4: lfov ext 3.0 b40s · axial · 0.32mm/px · z∈[-33,+156]mm · 8 of 75 slices shown, 10 images]
[im 6/75  soft-tissue]
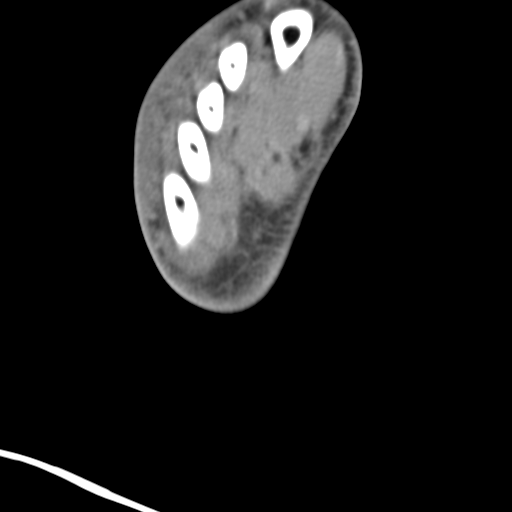
[im 6/75  bone]
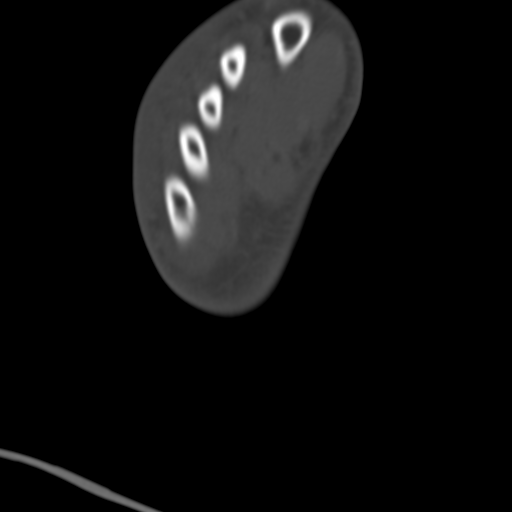
[im 18/75  bone]
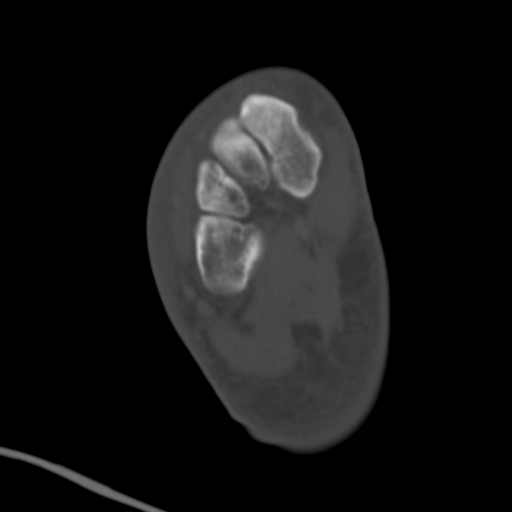
[im 23/75  bone]
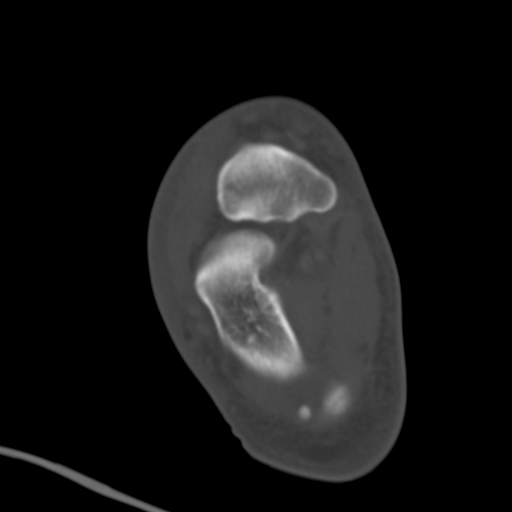
[im 35/75  bone]
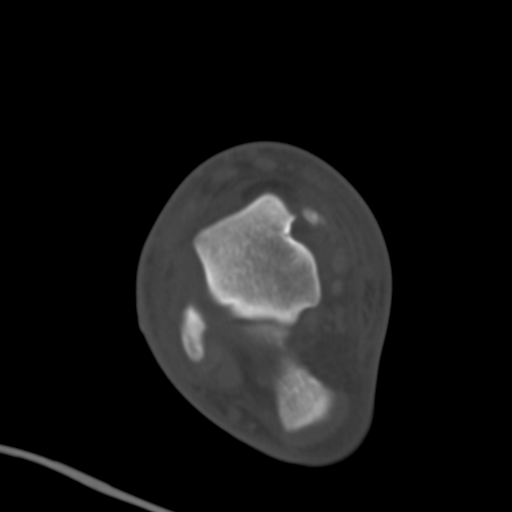
[im 40/75  soft-tissue]
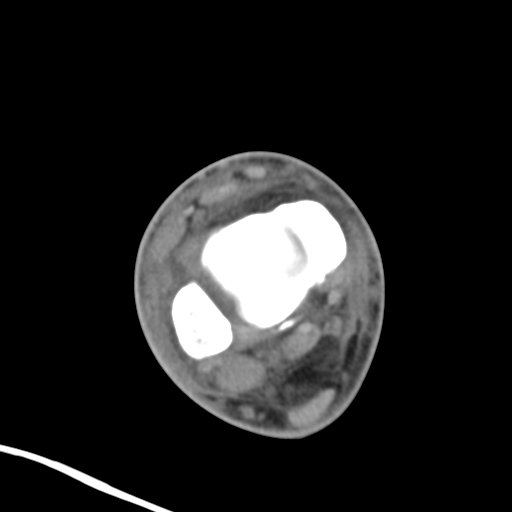
[im 40/75  bone]
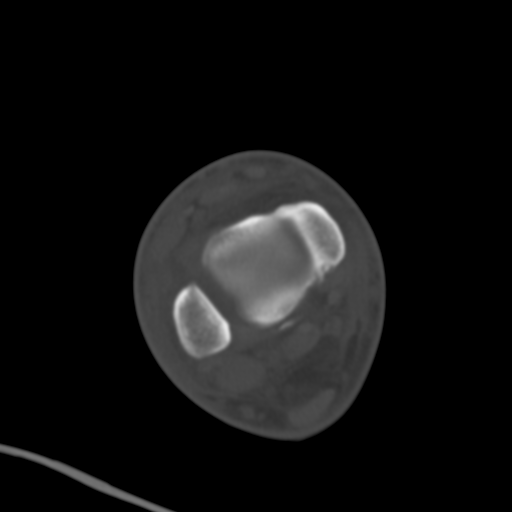
[im 52/75  bone]
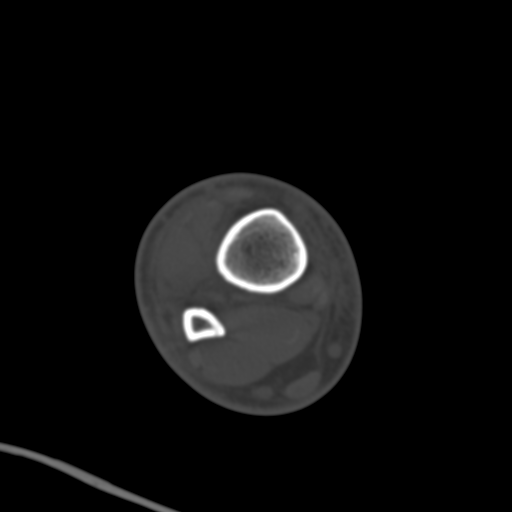
[im 57/75  bone]
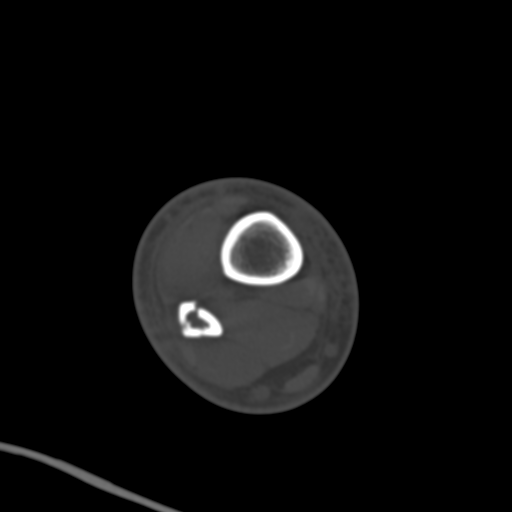
[im 69/75  bone]
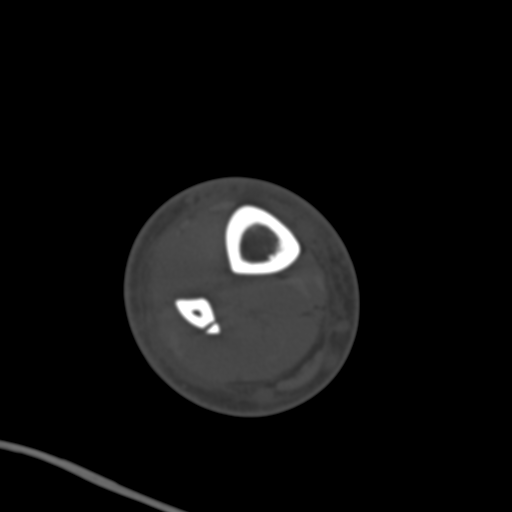

[Series 7: coronal bone · coronal · 0.28mm/px · 1 of 124 slices shown]
[im 62/124  bone]
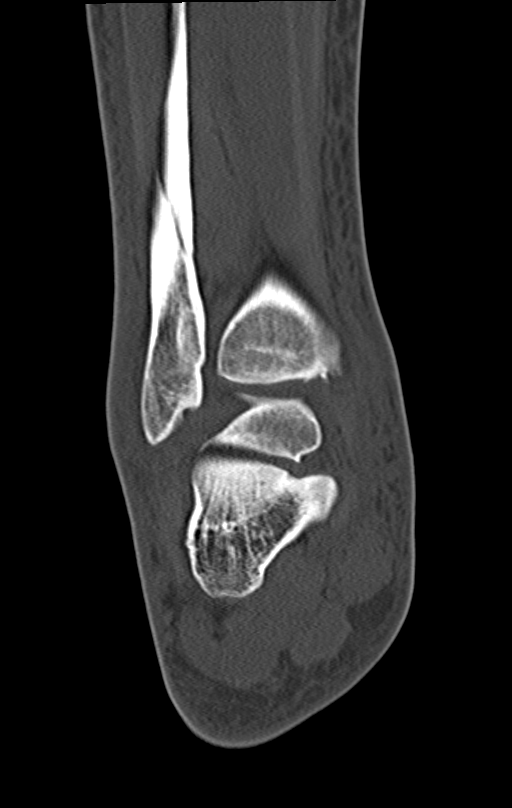

[Series 10: sagittalsoft tissue · sagittal · 0.45mm/px · 5 of 63 slices shown, 6 images]
[im 21/63  bone]
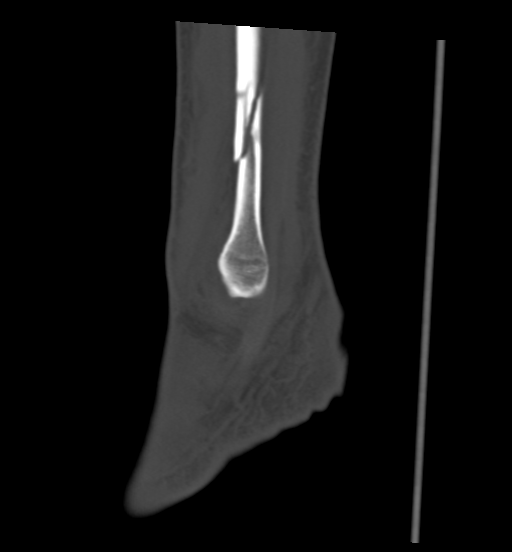
[im 26/63  bone]
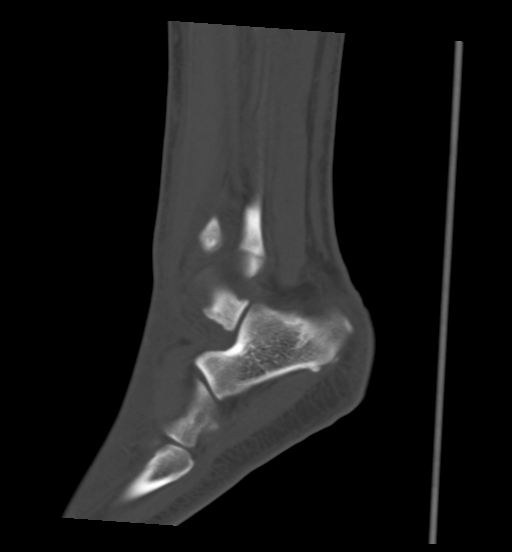
[im 32/63  soft-tissue]
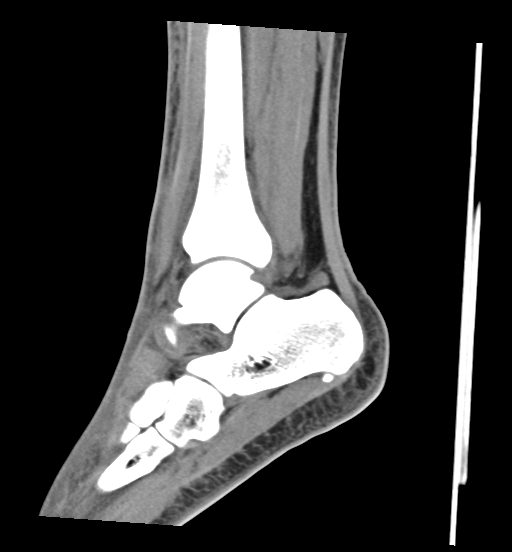
[im 32/63  bone]
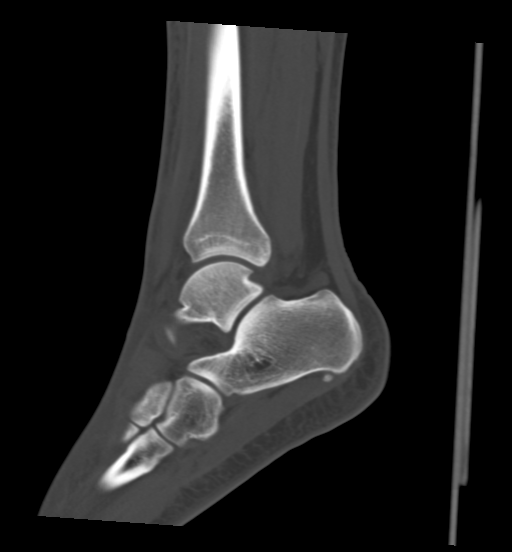
[im 37/63  bone]
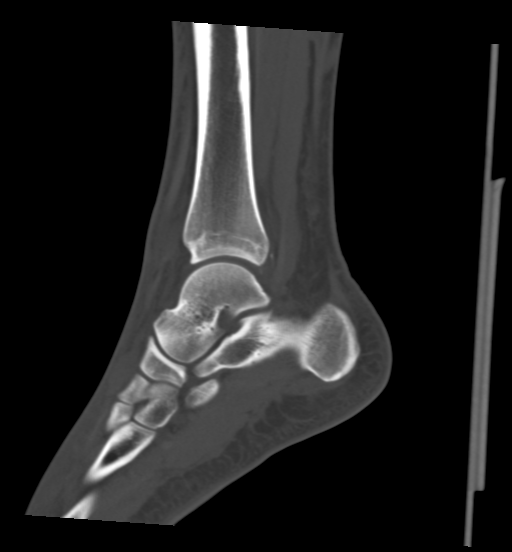
[im 42/63  bone]
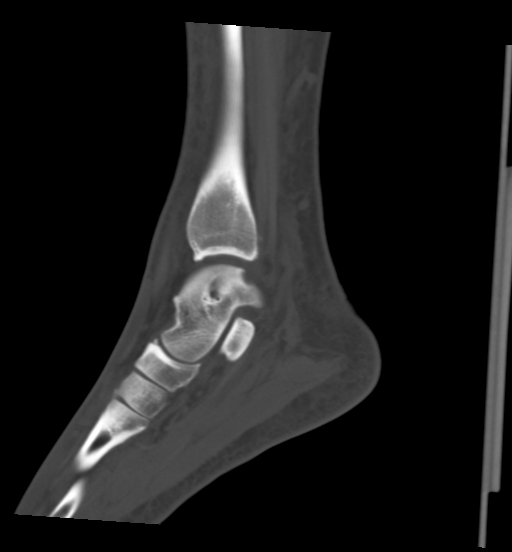

[14 of 33 positions shown; findings below may reference images not displayed]

FINDINGS: Bones/Joint/Cartilage

Oblique comminuted fracture of the distal fibular diaphysis with 1-2
mm of posterior displacement without angulation. Alignment is near
anatomic.

Small well corticated bony fragment adjacent to the tip of the
medial malleolus likely reflecting sequela of prior avulsive injury.

Additional small linear bony fragment adjacent to the anterior tip
of the medial malleolus concerning for an avulsion fracture. Small
well corticated bony fragment adjacent to the posterior malleolus
likely reflecting sequela prior injury.

Ankle mortise is intact. Subtalar joints are normal. Mild
osteoarthritis of the talonavicular joint. Small area of
calcification adjacent to the calcaneal insertion of plantar fascia.

Ligaments

Suboptimally assessed by CT.

Muscles and Tendons

Muscles are normal. Flexor, extensor, peroneal and Achilles tendons
are intact.

Soft tissues

Soft tissue edema around the ankle extending into the foot. No fluid
collection or hematoma.
IMPRESSION: 1. Oblique comminuted fracture of the distal fibular diaphysis with
1-2 mm of posterior displacement without angulation. Alignment is
near anatomic.
2. Small well corticated bony fragment adjacent to the tip of the
medial malleolus likely reflecting sequela of prior avulsive injury.
Additional small linear bony fragment adjacent to the anterior tip
of the medial malleolus concerning for a more acute avulsion
fracture.

## 2020-03-08 IMAGING — RF DG C-ARM 61-120 MIN
1 series · 5 of 5 positions shown · non-contrast
Comparison: 07/04/2018.

CLINICAL DATA: ORIF.

EXAM:
RIGHT ANKLE - COMPLETE 3+ VIEW; DG C-ARM 61-120 MIN

[Series 1: run · 5 of 5 slices shown]
[im 1/5]
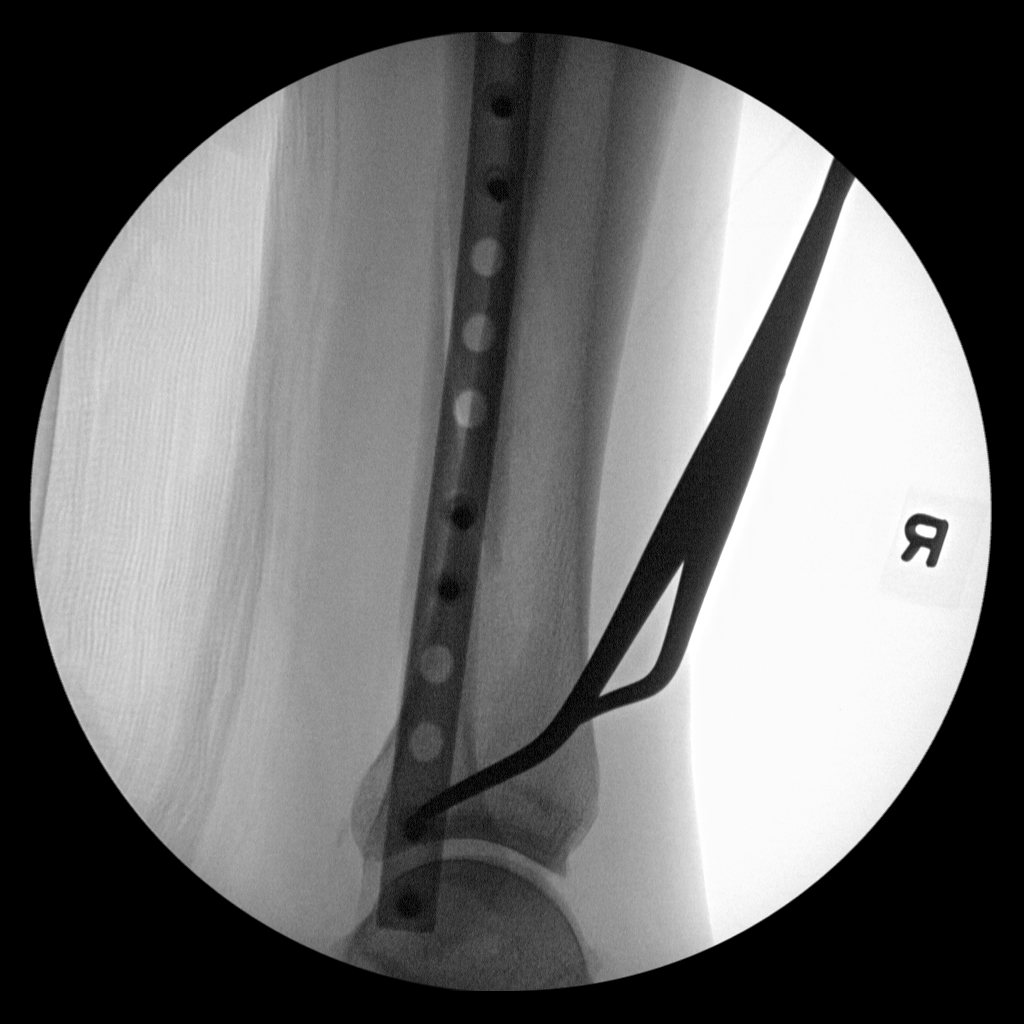
[im 2/5]
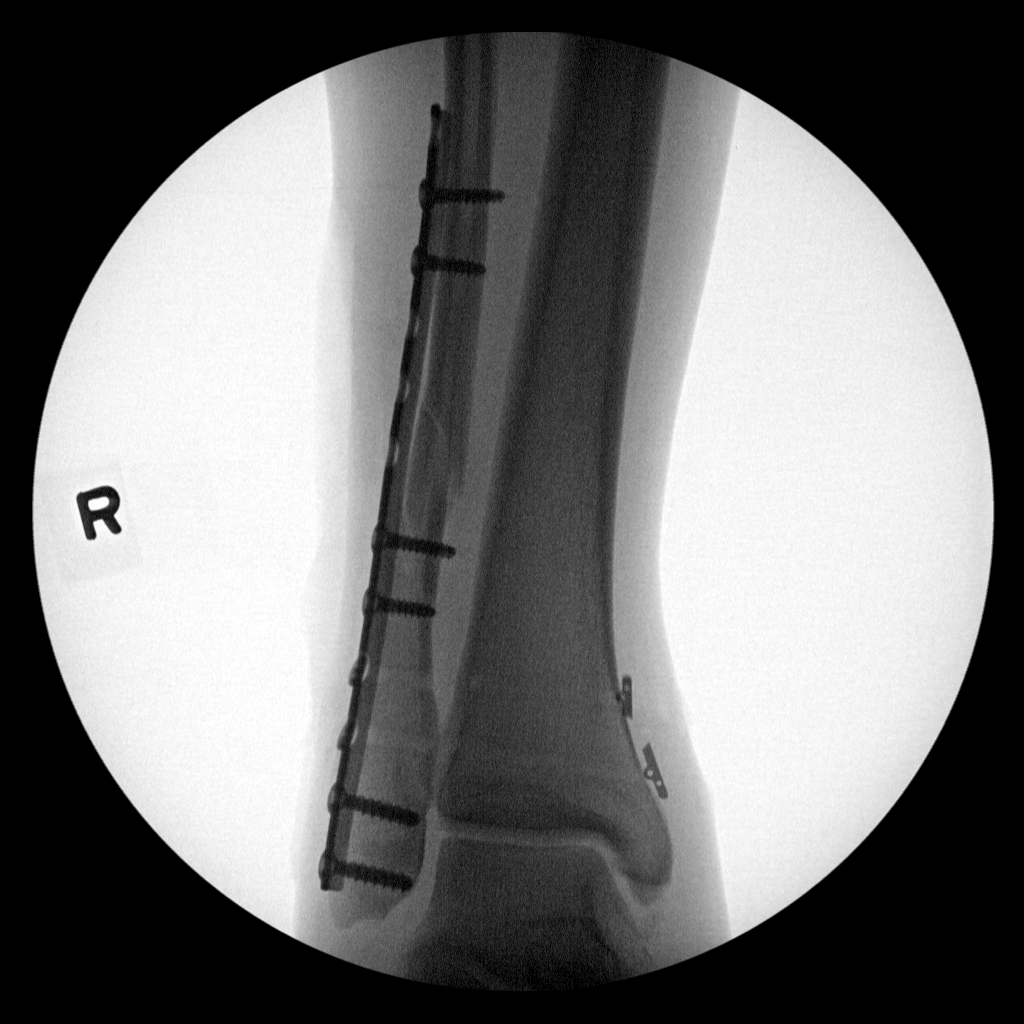
[im 3/5]
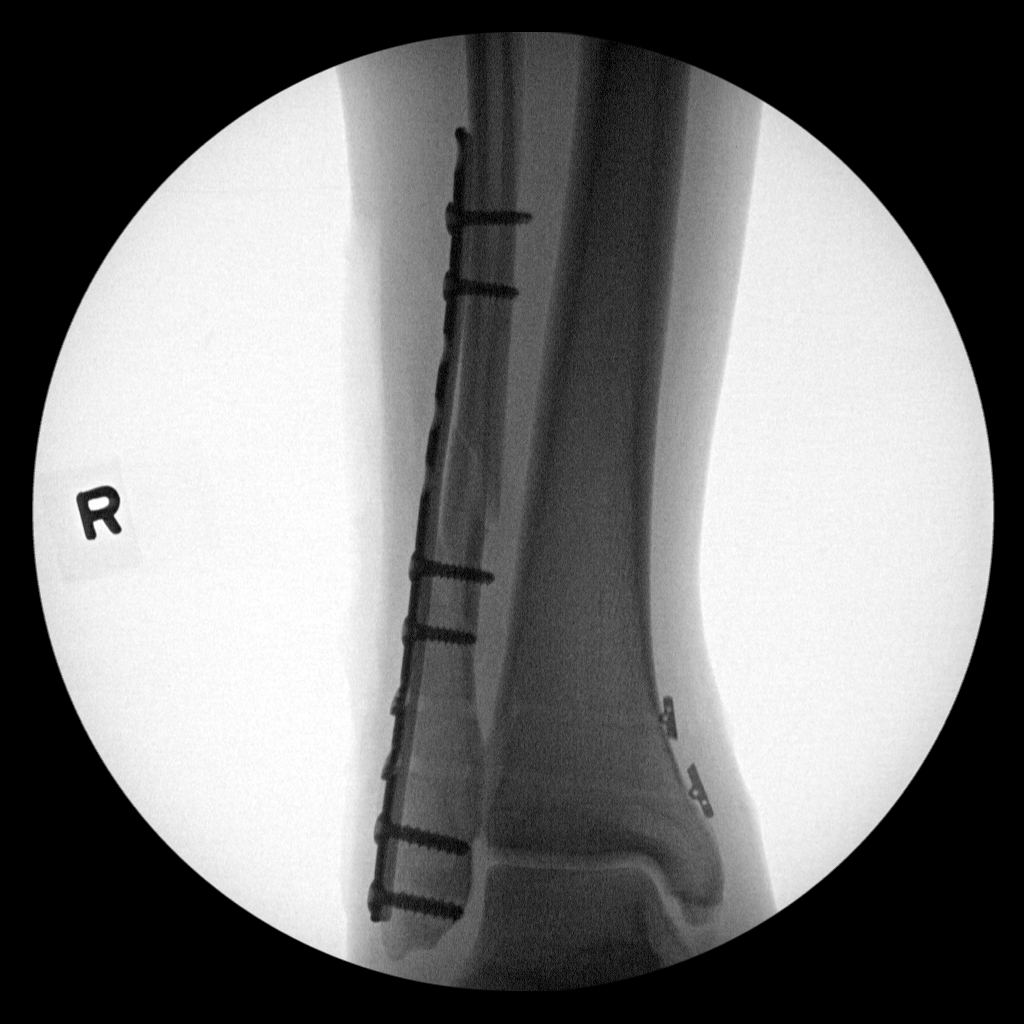
[im 4/5]
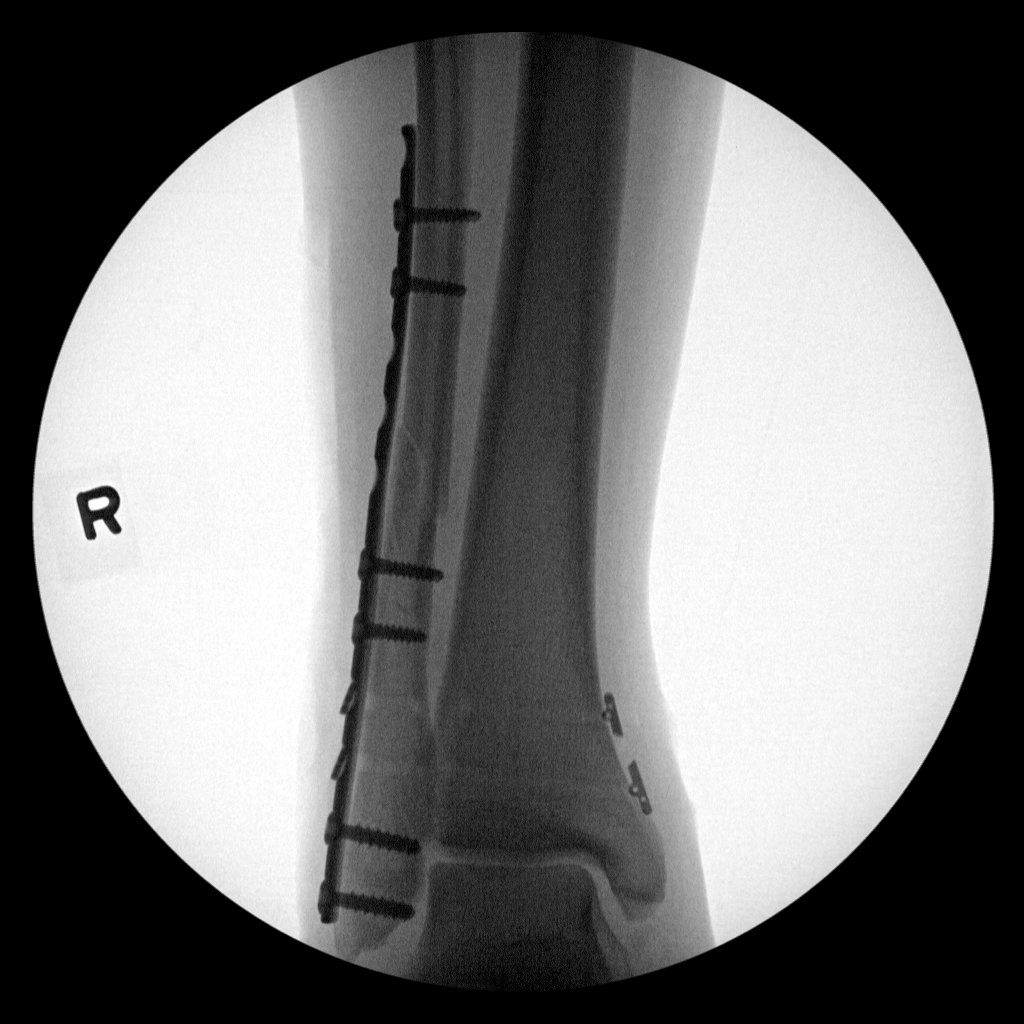
[im 5/5]
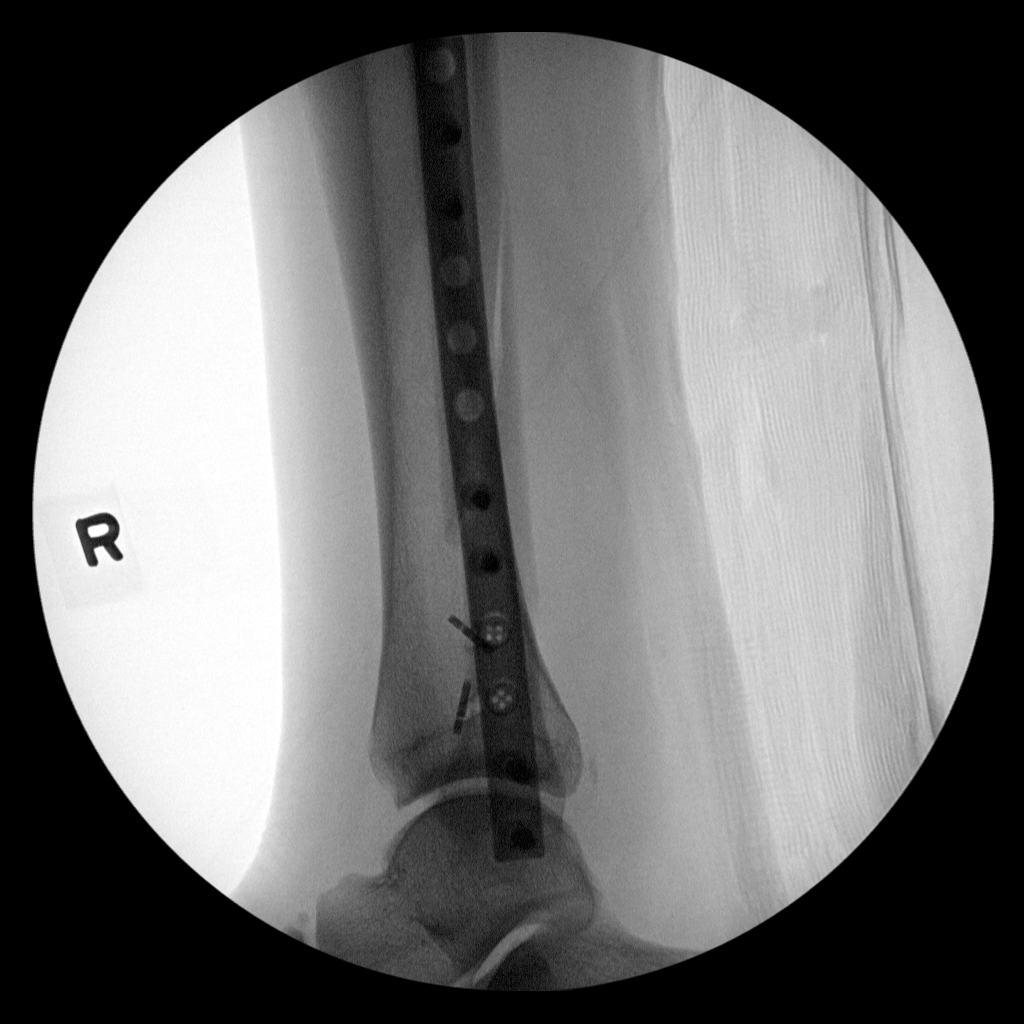

[5 of 5 positions shown; findings below may reference images not displayed]

FINDINGS: Plate and screw fixation of the fibula noted. Hardware intact. Near
anatomic alignment.
IMPRESSION: Plate screw fixation of the fibula with near anatomic alignment.

## 2022-02-22 ENCOUNTER — Ambulatory Visit
Admission: EM | Admit: 2022-02-22 | Discharge: 2022-02-22 | Disposition: A | Discharge status: Home / Self Care | Payer: Medicare Other

## 2022-02-22 DIAGNOSIS — R052 Subacute cough: Secondary | ICD-10-CM

## 2022-02-22 DIAGNOSIS — F17200 Nicotine dependence, unspecified, uncomplicated: Secondary | ICD-10-CM

## 2022-02-22 MED ORDER — BENZONATATE 100 MG PO CAPS: 100.0000 mg | ORAL_CAPSULE | Freq: Three times a day (TID) | ORAL | 0 refills | Status: AC | PRN

## 2022-02-22 MED ORDER — LEVOCETIRIZINE DIHYDROCHLORIDE 5 MG PO TABS: 5.0000 mg | ORAL_TABLET | Freq: Every evening | ORAL | 0 refills | Status: AC

## 2022-02-22 NOTE — ED Triage Notes (Signed)
Patient presents to Urgent Care with complaints of cough since for a awhile. Patient reports he smokes and is concerned for cancer.  ? ?
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
